# Patient Record
Sex: Female | Born: 1980 | Race: White | Hispanic: No | Smoking: Former smoker
Health system: Southern US, Community
[De-identification: ages and names within clinical notes are randomized; demographics above are authoritative.]

## PROBLEM LIST (undated history)

## (undated) DIAGNOSIS — T7840XA Allergy, unspecified, initial encounter: Secondary | ICD-10-CM

## (undated) DIAGNOSIS — R011 Cardiac murmur, unspecified: Secondary | ICD-10-CM

## (undated) DIAGNOSIS — J45909 Unspecified asthma, uncomplicated: Secondary | ICD-10-CM

## (undated) HISTORY — DX: Unspecified asthma, uncomplicated: J45.909

## (undated) HISTORY — PX: TONSILLECTOMY: SUR1361

## (undated) HISTORY — DX: Allergy, unspecified, initial encounter: T78.40XA

## (undated) HISTORY — PX: WISDOM TOOTH EXTRACTION: SHX21

---

## 2011-07-30 ENCOUNTER — Ambulatory Visit (INDEPENDENT_AMBULATORY_CARE_PROVIDER_SITE_OTHER): Payer: BC Managed Care – PPO | Admitting: Physician Assistant

## 2011-07-30 VITALS — BP 161/81 | HR 56 | Temp 98.5°F | Resp 18 | Ht 66.0 in | Wt 164.0 lb

## 2011-07-30 DIAGNOSIS — J302 Other seasonal allergic rhinitis: Secondary | ICD-10-CM

## 2011-07-30 DIAGNOSIS — L02419 Cutaneous abscess of limb, unspecified: Secondary | ICD-10-CM

## 2011-07-30 DIAGNOSIS — J309 Allergic rhinitis, unspecified: Secondary | ICD-10-CM

## 2011-07-30 DIAGNOSIS — L03119 Cellulitis of unspecified part of limb: Secondary | ICD-10-CM

## 2011-07-30 MED ORDER — SULFAMETHOXAZOLE-TRIMETHOPRIM 800-160 MG PO TABS: 1.0000 | ORAL_TABLET | Freq: Two times a day (BID) | ORAL | Status: AC

## 2011-07-30 NOTE — Patient Instructions (Signed)
Salt water soaks

## 2011-07-30 NOTE — Progress Notes (Signed)
  Subjective:    Patient ID: Angel Owens, female    DOB: [birth year], 31 y.o.   MRN: [id number]  HPI 31 yo CF presents w/ pruritic and painful area R lower inner thigh.  First noticed it last night.  Seemed like a pimple and she squeezed it.  She has not had a tick and she didn't feel anything bite her.  No f/c. No h/o MRSA or close contact with anyone with MRSA. She denies health problems other than allergies. Review of Systems  All other systems reviewed and are negative.        Objective:   Physical Exam  Nursing note and vitals reviewed. Constitutional: She is oriented to person, place, and time. She appears well-developed and well-nourished.  HENT:  Head: Normocephalic and atraumatic.  Cardiovascular: Normal rate and regular rhythm.   Murmur: 2/6 murmur. Pulmonary/Chest: Effort normal and breath sounds normal.  Neurological: She is alert and oriented to person, place, and time.  Skin:        Rechecked BP 132/82     Assessment & Plan:  Cellulitis thigh vs insect/spider bite- Take 10mg  zyrtec qd. Add septra.  Watch for worsening.

## 2013-06-09 LAB — US OB COMP LESS 14 WKS

## 2013-06-15 LAB — OB RESULTS CONSOLE RPR: RPR: NONREACTIVE

## 2013-06-15 LAB — OB RESULTS CONSOLE GC/CHLAMYDIA
CHLAMYDIA, DNA PROBE: NEGATIVE
GC PROBE AMP, GENITAL: NEGATIVE

## 2013-06-15 LAB — OB RESULTS CONSOLE ABO/RH: RH Type: NEGATIVE

## 2013-06-15 LAB — OB RESULTS CONSOLE HEPATITIS B SURFACE ANTIGEN: Hepatitis B Surface Ag: NEGATIVE

## 2013-06-15 LAB — OB RESULTS CONSOLE ANTIBODY SCREEN: Antibody Screen: NEGATIVE

## 2013-06-15 LAB — OB RESULTS CONSOLE HIV ANTIBODY (ROUTINE TESTING): HIV: NONREACTIVE

## 2013-06-15 LAB — OB RESULTS CONSOLE RUBELLA ANTIBODY, IGM: Rubella: IMMUNE

## 2013-07-08 ENCOUNTER — Other Ambulatory Visit: Payer: Self-pay

## 2013-07-08 LAB — US OB COMP LESS 14 WKS

## 2013-08-24 LAB — US OB DETAIL ADDL GEST + 14 WK

## 2013-08-25 ENCOUNTER — Other Ambulatory Visit (HOSPITAL_COMMUNITY): Payer: Self-pay | Admitting: Obstetrics and Gynecology

## 2013-08-25 DIAGNOSIS — O283 Abnormal ultrasonic finding on antenatal screening of mother: Secondary | ICD-10-CM

## 2013-08-25 DIAGNOSIS — Z3689 Encounter for other specified antenatal screening: Secondary | ICD-10-CM

## 2013-09-06 ENCOUNTER — Encounter (HOSPITAL_COMMUNITY): Payer: Self-pay

## 2013-09-06 ENCOUNTER — Other Ambulatory Visit: Payer: Self-pay

## 2013-09-06 ENCOUNTER — Ambulatory Visit (HOSPITAL_COMMUNITY)
Admission: RE | Admit: 2013-09-06 | Discharge: 2013-09-06 | Disposition: A | Discharge status: Home / Self Care | Payer: BC Managed Care – PPO | Source: Ambulatory Visit | Attending: Obstetrics and Gynecology | Admitting: Obstetrics and Gynecology

## 2013-09-06 VITALS — BP 111/67 | HR 65 | Wt 173.0 lb

## 2013-09-06 DIAGNOSIS — O283 Abnormal ultrasonic finding on antenatal screening of mother: Secondary | ICD-10-CM

## 2013-09-06 DIAGNOSIS — O289 Unspecified abnormal findings on antenatal screening of mother: Secondary | ICD-10-CM | POA: Insufficient documentation

## 2013-09-06 DIAGNOSIS — Z3689 Encounter for other specified antenatal screening: Secondary | ICD-10-CM | POA: Insufficient documentation

## 2013-09-06 LAB — CHROMOSOMES ANALYSIS FOR CF

## 2013-09-07 LAB — CMV IGM: CMV IgM: <8 AU/mL (ref <30.00)

## 2013-09-07 LAB — TOXOPLASMA ANTIBODIES- IGG AND  IGM

## 2013-09-07 LAB — CMV ANTIBODY, IGG (EIA)

## 2013-09-08 LAB — PARVOVIRUS B19 ANTIBODY, IGG AND IGM
PAROVIRUS B19 IGG ABS: 4.9 {index} — AB (ref <0.9)
Parovirus B19 IgM Abs: 0.2 index (ref <0.9)

## 2013-09-16 ENCOUNTER — Telehealth (HOSPITAL_COMMUNITY): Payer: Self-pay | Admitting: MS"

## 2013-09-16 NOTE — Telephone Encounter (Signed)
Left message for patient to return call.   Santiago Glad Erika Slaby 09/16/2013 4:23 PM

## 2013-09-17 NOTE — Telephone Encounter (Signed)
Called Karl Pock to discuss her cell free fetal DNA test results.  Mrs. Larayne Baxley had Panorama testing through [facility].  Testing was offered because of ultrasound finding of fetal echogenic bowel.   The patient was identified by name and DOB.  We reviewed that these are within normal limits, showing a less than 1 in 10,000 risk for trisomies 21, 18 and 13, and monosomy X (Turner syndrome).  In addition, the risk for triploidy/vanishing twin and sex chromosome trisomies (47,XXX and 47,XXY) was also low risk. We reviewed that this testing identifies > 99% of pregnancies with trisomy 74, trisomy 52, sex chromosome trisomies (47,XXX and 47,XXY), and triploidy. The detection rate for trisomy 18 is 96%.  The detection rate for monosomy X is ~92%.  The false positive rate is <0.1% for all conditions. Testing was also consistent with female fetal sex.  The patient did wish to know fetal sex.  She understands that this testing does not identify all genetic conditions.    Additionally, reviewed that TORCH titers were within normal limits. Cystic fibrosis carrier screening is still pending. We will contact the patient once CF carrier screening results are available. All questions were answered to her satisfaction, she was encouraged to call with additional questions or concerns.  Chipper Oman, MS Insurance risk surveyor

## 2013-09-20 ENCOUNTER — Encounter: Payer: Self-pay | Admitting: Gynecologic Oncology

## 2013-09-20 ENCOUNTER — Ambulatory Visit: Payer: BC Managed Care – PPO | Attending: Gynecologic Oncology | Admitting: Gynecologic Oncology

## 2013-09-20 DIAGNOSIS — O3482 Maternal care for other abnormalities of pelvic organs, second trimester: Secondary | ICD-10-CM

## 2013-09-20 DIAGNOSIS — N83209 Unspecified ovarian cyst, unspecified side: Secondary | ICD-10-CM | POA: Insufficient documentation

## 2013-09-20 DIAGNOSIS — O34599 Maternal care for other abnormalities of gravid uterus, unspecified trimester: Secondary | ICD-10-CM

## 2013-09-20 DIAGNOSIS — Z3492 Encounter for supervision of normal pregnancy, unspecified, second trimester: Secondary | ICD-10-CM | POA: Insufficient documentation

## 2013-09-20 DIAGNOSIS — J45909 Unspecified asthma, uncomplicated: Secondary | ICD-10-CM | POA: Insufficient documentation

## 2013-09-20 DIAGNOSIS — Z87891 Personal history of nicotine dependence: Secondary | ICD-10-CM | POA: Insufficient documentation

## 2013-09-20 DIAGNOSIS — Z79899 Other long term (current) drug therapy: Secondary | ICD-10-CM | POA: Insufficient documentation

## 2013-09-20 NOTE — Patient Instructions (Signed)
Please call our office if you have any concerns. Follow up with Dr. Julien Girt office.

## 2013-09-20 NOTE — Progress Notes (Signed)
Consult Note: Gyn-Onc  Consult was requested by Dr. Julien Girt for the evaluation of Angel Owens 33 y.o. female for a complex left ovarian mass in pregnancy.  CC:  Chief Complaint  Patient presents with  . Ovarian Cyst    Assessment/Plan:  Ms. Angel Owens  is a 33 y.o.  year old gravida 1 para 0 at approximately [redacted] weeks gestational age who is seen in consultation at the request of Dr. Julien Girt for a 6 cm complex solid appearing ovarian mass.  The concerning features of the mass are its solid and complex nature and its interval growth over a 10 week period on serial ultrasounds however overall my suspicion for malignancy is low. At this gestational age she would not be a candidate in my opinion for a minimally invasive approach to ovarian cystectomy. I generally reserved this approach for gestational ages 55-20 weeks. Therefore order to resect the mass a laparotomy would be necessary. I believe at this periviable gestational age that the risks of laparotomy outweighed the likelihood that this is malignant or will cause problems during the pregnancy (such as pain, torsion, or obstruction of labor).  Therefore I am regarding expectant management of the cyst for now. I recommend serial ultrasounds during the pregnancy, approximately monthly, to monitor the size and growth of the mass and potential for interference of the pregnancy or delivery. I do not recommend cesarean delivery unless indicated for obstetric reasons. However, if the patient does require a cesarean delivery, I would recommend left ovarian cystectomy or left salpingo-oophorectomy at the time of the surgery for definitive management and diagnosis of this mass. If the patient is to deliver vaginally, I recommend followup ultrasound at 6 weeks postpartum to confirm the presence of the lesion. If it is persistent at this time I would recommend surgical removal with a laparoscopy and ovarian cystectomy or salpingo-oophorectomy if  indicated. I believe this approach aware to offer the patient the best risk/benefit option.  Be happy to see the patient again after delivery for surgical management of the ovary if felt most appropriate by Dr. Julien Girt.   HPI: Angel Owens, is a 33 year old gravida 1 para 0 who is at [redacted] weeks gestational age with her first pregnancy and presents for consultation regarding her complex left ovarian cyst. The cyst was detected incidentally on first trimester ultrasound scan at 10 weeks. At that time it measured approximately 4 cm. A repeat ultrasound at [redacted] weeks GA confirmed persistence of the mass. It now measures 6.9 x 4.5 x 6.7 cm, with a simple appearing cyst noted on the mass measuring 2.7 x 3.9 x 2.5 cm. The ultrasound is not no ascites or nodularity the cul-de-sac. The patient continues to have no pain, bloating, early satiety, or symptoms from the mass.  Interval History: The patient's obstetrician Dr. Julien Girt has requested gynecologic oncology consultation, to determine whether intervention is necessary during the pregnancy.  Current Meds:  Outpatient Encounter Prescriptions as of 09/20/2013  Medication Sig  . Doxylamine-Pyridoxine (DICLEGIS PO) Take by mouth.  . fluticasone (FLONASE) 50 MCG/ACT nasal spray USE TWO SPRAYS IN EACH NOSTRIL DAILY  . montelukast (SINGULAIR) 10 MG tablet Take 10 mg by mouth at bedtime.  . Prenatal Vit-Fe Fumarate-FA (MULTIVITAMIN-PRENATAL) 27-0.8 MG TABS tablet Take 1 tablet by mouth daily at 12 noon.  . valACYclovir (VALTREX) 1000 MG tablet Take 1,000 mg by mouth 2 (two) times daily.    Allergy:  Allergies  Allergen Reactions  . Molds & Smuts     Runny  eyes     Social Hx:   History   Social History  . Marital Status: Divorced    Spouse Name: N/A    Number of Children: N/A  . Years of Education: N/A   Occupational History  . Not on file.   Social History Main Topics  . Smoking status: Former Research scientist (life sciences)  . Smokeless tobacco: Not on file  . Alcohol  Use: No  . Drug Use: No  . Sexual Activity: Yes   Other Topics Concern  . Not on file   Social History Narrative  . No narrative on file    Past Surgical Hx:  Past Surgical History  Procedure Laterality Date  . Wisdom tooth extraction    . Tonsillectomy      Past Medical Hx:  Past Medical History  Diagnosis Date  . Allergy   . Asthma     mild, allergy induced    Past Gynecological History:  The patient reports she has a history of making ovarian cysts. She's had one episode of the past ovarian cyst rupture, but has not required surgical intervention for ovarian cysts before. The pregnancy is otherwise uncomplicated. Patient's last menstrual period was 04/12/2013.  Family Hx:  Family History  Problem Relation Age of Onset  . Cancer Maternal Grandmother     pancreatic  . Cancer Maternal Grandfather     liver  . Cancer Paternal Grandfather     lung  . Cancer Other 50    ovarian cancer    Review of Systems:  Constitutional  Feels well,    ENT Normal appearing ears and nares bilaterally Skin/Breast  No rash, sores, jaundice, itching, dryness Cardiovascular  No chest pain, shortness of breath, or edema  Pulmonary  No cough or wheeze.  Gastro Intestinal  No nausea, vomitting, or diarrhoea. No bright red blood per rectum, no abdominal pain, change in bowel movement, or constipation.  Genito Urinary  No frequency, urgency, dysuria, see HPI Musculo Skeletal  No myalgia, arthralgia, joint swelling or pain  Neurologic  No weakness, numbness, change in gait,  Psychology  No depression, anxiety, insomnia.   Vitals:  Last menstrual period 04/12/2013.  Physical Exam: WD in NAD Neck  Supple NROM, without any enlargements.  Lymph Node Survey No cervical supraclavicular or inguinal adenopathy Cardiovascular  Pulse normal rate, regularity and rhythm. S1 and S2 normal.  Lungs  Clear to auscultation bilateraly, without wheezes/crackles/rhonchi. Good air movement.   Skin  No rash/lesions/breakdown  Psychiatry  Alert and oriented to person, place, and time  Abdomen  Normoactive bowel sounds, abdomen soft, non-tender and nonobese without evidence of hernia. Uterine fundus palpable approximately 2 fingerbreadths above the umbilicus. The ovarian mass is not palpable. Back No CVA tenderness Genito Urinary  Vulva/vagina: deferred Rectal  Good tone, no masses no cul de sac nodularity.  Extremities  No bilateral cyanosis, clubbing or edema.   Donaciano Eva, MD   09/20/2013, 10:18 AM

## 2013-09-24 ENCOUNTER — Telehealth (HOSPITAL_COMMUNITY): Payer: Self-pay | Admitting: MS"

## 2013-09-24 NOTE — Telephone Encounter (Signed)
Carrier screening for cystic fibrosis yielded a normal/negative result for the 32 most common disease-causing mutations, meaning that the risk to be a CF carrier can be reduced from 1 in 25 to approximately 1 in 250.  Therefore, the risk for cystic fibrosis in her current pregnancy is significantly reduced. The patient understands that CF carrier screening can reduce but not eliminate the chance to be a CF carrier.   Angel Owens 09/24/2013 4:33 PM

## 2013-09-24 NOTE — Telephone Encounter (Signed)
Left message for patient to return call.  Angel Owens 09/24/2013 4:30 PM

## 2013-09-29 ENCOUNTER — Encounter: Payer: Self-pay | Admitting: Obstetrics and Gynecology

## 2013-09-30 ENCOUNTER — Encounter: Payer: Self-pay | Admitting: Gynecologic Oncology

## 2013-10-11 ENCOUNTER — Ambulatory Visit (HOSPITAL_COMMUNITY): Payer: BC Managed Care – PPO

## 2014-01-03 ENCOUNTER — Encounter: Payer: Self-pay | Admitting: Gynecologic Oncology

## 2014-01-10 ENCOUNTER — Encounter (HOSPITAL_COMMUNITY): Payer: Self-pay

## 2014-01-10 ENCOUNTER — Encounter (HOSPITAL_COMMUNITY)
Admission: RE | Admit: 2014-01-10 | Discharge: 2014-01-10 | Disposition: A | Discharge status: Home / Self Care | Payer: BC Managed Care – PPO | Source: Ambulatory Visit | Attending: Obstetrics and Gynecology | Admitting: Obstetrics and Gynecology

## 2014-01-10 ENCOUNTER — Encounter (HOSPITAL_COMMUNITY): Payer: Self-pay | Admitting: Anesthesiology

## 2014-01-10 HISTORY — DX: Cardiac murmur, unspecified: R01.1

## 2014-01-10 LAB — CBC
HCT: 31.2 % — ABNORMAL LOW (ref 36.0–46.0)
Hemoglobin: 9.9 g/dL — ABNORMAL LOW (ref 12.0–15.0)
MCH: 26.1 pg (ref 26.0–34.0)
MCHC: 31.7 g/dL (ref 30.0–36.0)
MCV: 82.3 fL (ref 78.0–100.0)
Platelets: 401 10*3/uL — ABNORMAL HIGH (ref 150–400)
RBC: 3.79 MIL/uL — ABNORMAL LOW (ref 3.87–5.11)
RDW: 14.6 % (ref 11.5–15.5)
WBC: 12.1 10*3/uL — AB (ref 4.0–10.5)

## 2014-01-10 LAB — RPR

## 2014-01-10 NOTE — Pre-Procedure Instructions (Signed)
Per Roselyn Reef in lab, pt is positive for blood antibody and lab will set up units in prep for surgery.

## 2014-01-10 NOTE — H&P (Addendum)
Malika Demario is a 33 y.o. female G3P0 @ 49 wks presenting for c-section and exploratory laparotomy   Pt has been followed throughout pregnancy for pelvic mass ?fibroid vx ovarian mass.    History OB History    Gravida Para Term Preterm AB TAB SAB Ectopic Multiple Living   3 0 0 0 2 2 0 0 0 0      Past Medical History  Diagnosis Date  . Allergy   . Asthma     mild, allergy induced  . Heart murmur     asymptomatic   Past Surgical History  Procedure Laterality Date  . Wisdom tooth extraction    . Tonsillectomy     Family History: family history includes Cancer in her maternal grandfather, maternal grandmother, and paternal grandfather; Cancer (age of onset: 48) in her other. Social History:  reports that she has quit smoking. She does not have any smokeless tobacco history on file. She reports that she does not drink alcohol or use illicit drugs.   Prenatal Transfer Tool  Maternal Diabetes: No Genetic Screening: Normal Maternal Ultrasounds/Referrals: Abnormal:  Findings:   Other: complex 7cm mass in CDS Fetal Ultrasounds or other Referrals:  None Maternal Substance Abuse:  No Significant Maternal Medications:  None Significant Maternal Lab Results:  None Other Comments:  None  ROS    Last menstrual period 04/12/2013. Exam Physical Exam  BP 126/80 Gen - NAD Abd - gravid, NT Ext - NT CV - rrr Lungs - clear PV - cvx 1cm Prenatal labs: ABO, Rh: --/--/A NEG (11/09 1226) Antibody: POS (11/09 1226) Rubella: Immune (04/14 0000) RPR: Nonreactive (04/14 0000)  HBsAg: Negative (04/14 0000)  HIV: Non-reactive (04/14 0000)  GBS:     Assessment/Plan: IUP at term with complex mass in CDS Plan c-section with evaluation of mass R/b/a discussed, questions answered, informed consent   Vasili Fok 01/10/2014, 4:32 PM

## 2014-01-10 NOTE — Patient Instructions (Signed)
Your procedure is scheduled on:01/11/14  Enter through the Main Entrance at :[place] up desk phone and dial [phone] and inform us of your arrival.  Please call [phone] if you have any problems the morning of surgery.  Remember: Do not eat food or drink liquids, including water, after midnight:tonight   You may brush your teeth the morning of surgery  DO NOT wear jewelry, eye make-up, lipstick,body lotion, or dark fingernail polish.  (Polished toes are ok) You may wear deodorant.  If you are to be admitted after surgery, leave suitcase in car until your room has been assigned. Patients discharged on the day of surgery will not be allowed to drive home. Wear loose fitting, comfortable clothes for your ride home.

## 2014-01-10 NOTE — Anesthesia Preprocedure Evaluation (Addendum)
Anesthesia Evaluation  Patient identified by MRN, date of birth, ID band Patient awake    Reviewed: Allergy & Precautions, H&P , NPO status , Patient's Chart, lab work & pertinent test results  Airway Mallampati: II  TM Distance: >3 FB Neck ROM: Full    Dental no notable dental hx. (+) Teeth Intact   Pulmonary asthma , former smoker,  breath sounds clear to auscultation  Pulmonary exam normal       Cardiovascular negative cardio ROS  + Valvular Problems/Murmurs MR Rhythm:Regular Rate:Normal + Systolic murmurs    Neuro/Psych negative neurological ROS  negative psych ROS   GI/Hepatic Neg liver ROS, GERD-  Medicated and Controlled,  Endo/Other  Obesity  Renal/GU negative Renal ROS  negative genitourinary   Musculoskeletal negative musculoskeletal ROS (+)   Abdominal   Peds  Hematology  (+) anemia ,   Anesthesia Other Findings   Reproductive/Obstetrics (+) Pregnancy Mass in cul de sac                           Anesthesia Physical Anesthesia Plan  ASA: II  Anesthesia Plan: Combined Spinal and Epidural   Post-op Pain Management:    Induction:   Airway Management Planned: Natural Airway  Additional Equipment:   Intra-op Plan:   Post-operative Plan:   Informed Consent: I have reviewed the patients History and Physical, chart, labs and discussed the procedure including the risks, benefits and alternatives for the proposed anesthesia with the patient or authorized representative who has indicated his/her understanding and acceptance.   Dental advisory given  Plan Discussed with: Anesthesiologist, CRNA and Surgeon  Anesthesia Plan Comments:        Anesthesia Quick Evaluation

## 2014-01-11 ENCOUNTER — Inpatient Hospital Stay (HOSPITAL_COMMUNITY): Payer: BC Managed Care – PPO | Admitting: Anesthesiology

## 2014-01-11 ENCOUNTER — Encounter (HOSPITAL_COMMUNITY): Payer: Self-pay | Admitting: Anesthesiology

## 2014-01-11 ENCOUNTER — Inpatient Hospital Stay (HOSPITAL_COMMUNITY)
Admission: AD | Admit: 2014-01-11 | Discharge: 2014-01-13 | DRG: 766 | Disposition: A | Discharge status: Home / Self Care | Payer: BC Managed Care – PPO | Source: Ambulatory Visit | Attending: Obstetrics and Gynecology | Admitting: Obstetrics and Gynecology

## 2014-01-11 ENCOUNTER — Encounter (HOSPITAL_COMMUNITY)
Admission: AD | Disposition: A | Discharge status: Home / Self Care | Payer: Self-pay | Source: Ambulatory Visit | Attending: Obstetrics and Gynecology

## 2014-01-11 DIAGNOSIS — O3482 Maternal care for other abnormalities of pelvic organs, second trimester: Secondary | ICD-10-CM

## 2014-01-11 DIAGNOSIS — N838 Other noninflammatory disorders of ovary, fallopian tube and broad ligament: Secondary | ICD-10-CM | POA: Diagnosis present

## 2014-01-11 DIAGNOSIS — Z87891 Personal history of nicotine dependence: Secondary | ICD-10-CM

## 2014-01-11 DIAGNOSIS — N83209 Unspecified ovarian cyst, unspecified side: Secondary | ICD-10-CM

## 2014-01-11 DIAGNOSIS — O9989 Other specified diseases and conditions complicating pregnancy, childbirth and the puerperium: Secondary | ICD-10-CM | POA: Diagnosis present

## 2014-01-11 DIAGNOSIS — D271 Benign neoplasm of left ovary: Secondary | ICD-10-CM | POA: Diagnosis present

## 2014-01-11 DIAGNOSIS — O99613 Diseases of the digestive system complicating pregnancy, third trimester: Secondary | ICD-10-CM | POA: Diagnosis present

## 2014-01-11 DIAGNOSIS — K219 Gastro-esophageal reflux disease without esophagitis: Secondary | ICD-10-CM | POA: Diagnosis present

## 2014-01-11 DIAGNOSIS — O3483 Maternal care for other abnormalities of pelvic organs, third trimester: Secondary | ICD-10-CM | POA: Diagnosis present

## 2014-01-11 DIAGNOSIS — Z3492 Encounter for supervision of normal pregnancy, unspecified, second trimester: Secondary | ICD-10-CM

## 2014-01-11 DIAGNOSIS — J302 Other seasonal allergic rhinitis: Secondary | ICD-10-CM

## 2014-01-11 LAB — PREPARE RBC (CROSSMATCH)

## 2014-01-11 SURGERY — Surgical Case
Anesthesia: Spinal

## 2014-01-11 MED ORDER — SODIUM CHLORIDE 0.9 % IJ SOLN
3.0000 mL | INTRAMUSCULAR | Status: DC | PRN
  Administered 2014-01-11: 3 mL via INTRAVENOUS
  Filled 2014-01-11: qty 3

## 2014-01-11 MED ORDER — SENNOSIDES-DOCUSATE SODIUM 8.6-50 MG PO TABS
2.0000 | ORAL_TABLET | ORAL | Status: DC
  Administered 2014-01-12 – 2014-01-13 (×2): 2 via ORAL
  Filled 2014-01-11 (×2): qty 2

## 2014-01-11 MED ORDER — FLUTICASONE PROPIONATE 50 MCG/ACT NA SUSP: 1.0000 | Freq: Every day | NASAL | Status: DC | PRN

## 2014-01-11 MED ORDER — DIBUCAINE 1 % RE OINT: 1.0000 "application " | TOPICAL_OINTMENT | RECTAL | Status: DC | PRN

## 2014-01-11 MED ORDER — MENTHOL 3 MG MT LOZG
1.0000 | LOZENGE | OROMUCOSAL | Status: DC | PRN
  Administered 2014-01-11: 3 mg via ORAL
  Filled 2014-01-11: qty 9

## 2014-01-11 MED ORDER — SODIUM CHLORIDE 0.9 % IJ SOLN
INTRAMUSCULAR | Status: AC
  Filled 2014-01-11: qty 10

## 2014-01-11 MED ORDER — EPHEDRINE SULFATE 50 MG/ML IJ SOLN
INTRAMUSCULAR | Status: DC | PRN
  Administered 2014-01-11 (×2): 5 mg via INTRAVENOUS

## 2014-01-11 MED ORDER — KETOROLAC TROMETHAMINE 30 MG/ML IJ SOLN
INTRAMUSCULAR | Status: AC
  Administered 2014-01-11: 30 mg via INTRAMUSCULAR
  Filled 2014-01-11: qty 1

## 2014-01-11 MED ORDER — MORPHINE SULFATE 0.5 MG/ML IJ SOLN
INTRAMUSCULAR | Status: AC
  Filled 2014-01-11: qty 10

## 2014-01-11 MED ORDER — IBUPROFEN 600 MG PO TABS
600.0000 mg | ORAL_TABLET | Freq: Four times a day (QID) | ORAL | Status: DC
  Administered 2014-01-12 – 2014-01-13 (×7): 600 mg via ORAL
  Filled 2014-01-11 (×7): qty 1

## 2014-01-11 MED ORDER — DIPHENHYDRAMINE HCL 50 MG/ML IJ SOLN: 12.5000 mg | INTRAMUSCULAR | Status: DC | PRN

## 2014-01-11 MED ORDER — SCOPOLAMINE 1 MG/3DAYS TD PT72
MEDICATED_PATCH | TRANSDERMAL | Status: AC
  Administered 2014-01-11: 1.5 mg via TRANSDERMAL
  Filled 2014-01-11: qty 1

## 2014-01-11 MED ORDER — PRENATAL MULTIVITAMIN CH
1.0000 | ORAL_TABLET | Freq: Every day | ORAL | Status: DC
  Administered 2014-01-12 – 2014-01-13 (×2): 1 via ORAL
  Filled 2014-01-11 (×2): qty 1

## 2014-01-11 MED ORDER — SCOPOLAMINE 1 MG/3DAYS TD PT72
1.0000 | MEDICATED_PATCH | Freq: Once | TRANSDERMAL | Status: DC
  Administered 2014-01-11: 1.5 mg via TRANSDERMAL

## 2014-01-11 MED ORDER — SODIUM BICARBONATE 8.4 % IV SOLN
INTRAVENOUS | Status: AC
  Filled 2014-01-11: qty 50

## 2014-01-11 MED ORDER — CEFAZOLIN SODIUM-DEXTROSE 2-3 GM-% IV SOLR
INTRAVENOUS | Status: AC
  Filled 2014-01-11: qty 50

## 2014-01-11 MED ORDER — SIMETHICONE 80 MG PO CHEW
80.0000 mg | CHEWABLE_TABLET | Freq: Three times a day (TID) | ORAL | Status: DC
  Administered 2014-01-12 – 2014-01-13 (×2): 80 mg via ORAL
  Filled 2014-01-11 (×2): qty 1

## 2014-01-11 MED ORDER — MONTELUKAST SODIUM 10 MG PO TABS
10.0000 mg | ORAL_TABLET | Freq: Every day | ORAL | Status: DC
  Administered 2014-01-11 – 2014-01-12 (×2): 10 mg via ORAL
  Filled 2014-01-11 (×4): qty 1

## 2014-01-11 MED ORDER — CEFAZOLIN SODIUM-DEXTROSE 2-3 GM-% IV SOLR
2.0000 g | INTRAVENOUS | Status: AC
  Administered 2014-01-11: 2 g via INTRAVENOUS

## 2014-01-11 MED ORDER — DIPHENHYDRAMINE HCL 25 MG PO CAPS
25.0000 mg | ORAL_CAPSULE | ORAL | Status: DC | PRN
  Filled 2014-01-11: qty 1

## 2014-01-11 MED ORDER — LIDOCAINE-EPINEPHRINE 2 %-1:100000 IJ SOLN: INTRAMUSCULAR | Status: DC | PRN

## 2014-01-11 MED ORDER — NALBUPHINE HCL 10 MG/ML IJ SOLN: 5.0000 mg | Freq: Once | INTRAMUSCULAR | Status: AC | PRN

## 2014-01-11 MED ORDER — NALBUPHINE HCL 10 MG/ML IJ SOLN
5.0000 mg | INTRAMUSCULAR | Status: DC | PRN
  Filled 2014-01-11: qty 1

## 2014-01-11 MED ORDER — ONDANSETRON HCL 4 MG/2ML IJ SOLN: 4.0000 mg | INTRAMUSCULAR | Status: DC | PRN

## 2014-01-11 MED ORDER — OXYTOCIN 10 UNIT/ML IJ SOLN
INTRAMUSCULAR | Status: AC
  Filled 2014-01-11: qty 4

## 2014-01-11 MED ORDER — DEXTROSE IN LACTATED RINGERS 5 % IV SOLN
INTRAVENOUS | Status: DC
  Administered 2014-01-11: 17:00:00 via INTRAVENOUS

## 2014-01-11 MED ORDER — LACTATED RINGERS IV SOLN
INTRAVENOUS | Status: DC | PRN
Start: 2014-01-11 — End: 2014-01-11
  Administered 2014-01-11: 08:00:00 via INTRAVENOUS

## 2014-01-11 MED ORDER — NALBUPHINE HCL 10 MG/ML IJ SOLN
5.0000 mg | Freq: Once | INTRAMUSCULAR | Status: AC | PRN
  Administered 2014-01-11: 5 mg via INTRAVENOUS

## 2014-01-11 MED ORDER — ONDANSETRON HCL 4 MG PO TABS: 4.0000 mg | ORAL_TABLET | ORAL | Status: DC | PRN

## 2014-01-11 MED ORDER — MEDROXYPROGESTERONE ACETATE 150 MG/ML IM SUSP: 150.0000 mg | INTRAMUSCULAR | Status: DC | PRN

## 2014-01-11 MED ORDER — KETOROLAC TROMETHAMINE 30 MG/ML IJ SOLN
30.0000 mg | Freq: Four times a day (QID) | INTRAMUSCULAR | Status: AC | PRN
  Administered 2014-01-11: 30 mg via INTRAMUSCULAR

## 2014-01-11 MED ORDER — TETANUS-DIPHTH-ACELL PERTUSSIS 5-2.5-18.5 LF-MCG/0.5 IM SUSP: 0.5000 mL | Freq: Once | INTRAMUSCULAR | Status: DC

## 2014-01-11 MED ORDER — OXYCODONE-ACETAMINOPHEN 5-325 MG PO TABS: 2.0000 | ORAL_TABLET | ORAL | Status: DC | PRN

## 2014-01-11 MED ORDER — OXYTOCIN 10 UNIT/ML IJ SOLN
40.0000 [IU] | INTRAVENOUS | Status: DC | PRN
  Administered 2014-01-11: 40 [IU] via INTRAVENOUS

## 2014-01-11 MED ORDER — ONDANSETRON HCL 4 MG/2ML IJ SOLN
INTRAMUSCULAR | Status: DC | PRN
  Administered 2014-01-11: 4 mg via INTRAVENOUS

## 2014-01-11 MED ORDER — SCOPOLAMINE 1 MG/3DAYS TD PT72: 1.0000 | MEDICATED_PATCH | Freq: Once | TRANSDERMAL | Status: DC

## 2014-01-11 MED ORDER — NALOXONE HCL 1 MG/ML IJ SOLN
1.0000 ug/kg/h | INTRAVENOUS | Status: DC | PRN
  Filled 2014-01-11: qty 2

## 2014-01-11 MED ORDER — DIPHENHYDRAMINE HCL 25 MG PO CAPS: 25.0000 mg | ORAL_CAPSULE | Freq: Four times a day (QID) | ORAL | Status: DC | PRN

## 2014-01-11 MED ORDER — FENTANYL CITRATE 0.05 MG/ML IJ SOLN
INTRAMUSCULAR | Status: DC | PRN
  Administered 2014-01-11: 100 ug via EPIDURAL

## 2014-01-11 MED ORDER — PHENYLEPHRINE 8 MG IN D5W 100 ML (0.08MG/ML) PREMIX OPTIME
INJECTION | INTRAVENOUS | Status: DC | PRN
  Administered 2014-01-11: 60 ug/min via INTRAVENOUS

## 2014-01-11 MED ORDER — LANOLIN HYDROUS EX OINT: 1.0000 "application " | TOPICAL_OINTMENT | CUTANEOUS | Status: DC | PRN

## 2014-01-11 MED ORDER — LIDOCAINE-EPINEPHRINE (PF) 2 %-1:200000 IJ SOLN
INTRAMUSCULAR | Status: DC | PRN
  Administered 2014-01-11: 5 mL via EPIDURAL
  Administered 2014-01-11: 11 mL via EPIDURAL
  Administered 2014-01-11: 4 mL via EPIDURAL

## 2014-01-11 MED ORDER — ONDANSETRON HCL 4 MG/2ML IJ SOLN
INTRAMUSCULAR | Status: AC
  Filled 2014-01-11: qty 2

## 2014-01-11 MED ORDER — PHENYLEPHRINE HCL 10 MG/ML IJ SOLN
INTRAMUSCULAR | Status: DC | PRN
  Administered 2014-01-11 (×2): 80 ug via INTRAVENOUS
  Administered 2014-01-11: 40 ug via INTRAVENOUS

## 2014-01-11 MED ORDER — OXYTOCIN 40 UNITS IN LACTATED RINGERS INFUSION - SIMPLE MED: 62.5000 mL/h | INTRAVENOUS | Status: AC

## 2014-01-11 MED ORDER — MEPERIDINE HCL 25 MG/ML IJ SOLN: 6.2500 mg | INTRAMUSCULAR | Status: DC | PRN

## 2014-01-11 MED ORDER — MEASLES, MUMPS & RUBELLA VAC ~~LOC~~ INJ: 0.5000 mL | INJECTION | Freq: Once | SUBCUTANEOUS | Status: DC

## 2014-01-11 MED ORDER — SIMETHICONE 80 MG PO CHEW
80.0000 mg | CHEWABLE_TABLET | ORAL | Status: DC
  Administered 2014-01-12 – 2014-01-13 (×2): 80 mg via ORAL
  Filled 2014-01-11 (×2): qty 1

## 2014-01-11 MED ORDER — SCOPOLAMINE 1 MG/3DAYS TD PT72: 1.0000 | MEDICATED_PATCH | TRANSDERMAL | Status: DC

## 2014-01-11 MED ORDER — LACTATED RINGERS IV SOLN
INTRAVENOUS | Status: DC
  Administered 2014-01-11 (×3): via INTRAVENOUS

## 2014-01-11 MED ORDER — LIDOCAINE-EPINEPHRINE (PF) 2 %-1:200000 IJ SOLN
INTRAMUSCULAR | Status: AC
  Filled 2014-01-11: qty 20

## 2014-01-11 MED ORDER — FENTANYL CITRATE 0.05 MG/ML IJ SOLN: 25.0000 ug | INTRAMUSCULAR | Status: DC | PRN

## 2014-01-11 MED ORDER — FENTANYL CITRATE 0.05 MG/ML IJ SOLN
INTRAMUSCULAR | Status: AC
  Filled 2014-01-11: qty 2

## 2014-01-11 MED ORDER — NALOXONE HCL 0.4 MG/ML IJ SOLN: 0.4000 mg | INTRAMUSCULAR | Status: DC | PRN

## 2014-01-11 MED ORDER — MORPHINE SULFATE (PF) 0.5 MG/ML IJ SOLN
INTRAMUSCULAR | Status: DC | PRN
  Administered 2014-01-11: 3 mg via EPIDURAL

## 2014-01-11 MED ORDER — 0.9 % SODIUM CHLORIDE (POUR BTL) OPTIME
TOPICAL | Status: DC | PRN
  Administered 2014-01-11: 1000 mL

## 2014-01-11 MED ORDER — SIMETHICONE 80 MG PO CHEW: 80.0000 mg | CHEWABLE_TABLET | ORAL | Status: DC | PRN

## 2014-01-11 MED ORDER — PHENYLEPHRINE 8 MG IN D5W 100 ML (0.08MG/ML) PREMIX OPTIME: INJECTION | INTRAVENOUS | Status: DC | PRN

## 2014-01-11 MED ORDER — OXYCODONE-ACETAMINOPHEN 5-325 MG PO TABS
1.0000 | ORAL_TABLET | ORAL | Status: DC | PRN
  Administered 2014-01-13: 1 via ORAL
  Filled 2014-01-11: qty 1

## 2014-01-11 MED ORDER — VALACYCLOVIR HCL 500 MG PO TABS: 1000.0000 mg | ORAL_TABLET | Freq: Two times a day (BID) | ORAL | Status: DC

## 2014-01-11 MED ORDER — WITCH HAZEL-GLYCERIN EX PADS: 1.0000 "application " | MEDICATED_PAD | CUTANEOUS | Status: DC | PRN

## 2014-01-11 MED ORDER — PHENYLEPHRINE 8 MG IN D5W 100 ML (0.08MG/ML) PREMIX OPTIME
INJECTION | INTRAVENOUS | Status: AC
  Filled 2014-01-11: qty 100

## 2014-01-11 MED ORDER — KETOROLAC TROMETHAMINE 30 MG/ML IJ SOLN
30.0000 mg | Freq: Four times a day (QID) | INTRAMUSCULAR | Status: AC | PRN
  Administered 2014-01-11: 30 mg via INTRAVENOUS
  Filled 2014-01-11: qty 1

## 2014-01-11 MED ORDER — ONDANSETRON HCL 4 MG/2ML IJ SOLN: 4.0000 mg | Freq: Three times a day (TID) | INTRAMUSCULAR | Status: DC | PRN

## 2014-01-11 MED ORDER — NALBUPHINE HCL 10 MG/ML IJ SOLN: 5.0000 mg | INTRAMUSCULAR | Status: DC | PRN

## 2014-01-11 SURGICAL SUPPLY — 32 items
CLAMP CORD UMBIL (MISCELLANEOUS) IMPLANT
CLOTH BEACON ORANGE TIMEOUT ST (SAFETY) ×3 IMPLANT
DERMABOND ADVANCED (GAUZE/BANDAGES/DRESSINGS) ×2
DERMABOND ADVANCED .7 DNX12 (GAUZE/BANDAGES/DRESSINGS) ×1 IMPLANT
DRAPE SHEET LG 3/4 BI-LAMINATE (DRAPES) IMPLANT
DRSG OPSITE POSTOP 4X10 (GAUZE/BANDAGES/DRESSINGS) ×3 IMPLANT
DURAPREP 26ML APPLICATOR (WOUND CARE) ×3 IMPLANT
ELECT REM PT RETURN 9FT ADLT (ELECTROSURGICAL) ×3
ELECTRODE REM PT RTRN 9FT ADLT (ELECTROSURGICAL) ×1 IMPLANT
EXTRACTOR VACUUM M CUP 4 TUBE (SUCTIONS) IMPLANT
EXTRACTOR VACUUM M CUP 4' TUBE (SUCTIONS)
GLOVE BIO SURGEON STRL SZ 6.5 (GLOVE) ×2 IMPLANT
GLOVE BIO SURGEONS STRL SZ 6.5 (GLOVE) ×1
GLOVE BIOGEL PI IND STRL 7.0 (GLOVE) ×1 IMPLANT
GLOVE BIOGEL PI INDICATOR 7.0 (GLOVE) ×2
GOWN STRL REUS W/TWL LRG LVL3 (GOWN DISPOSABLE) ×6 IMPLANT
KIT ABG SYR 3ML LUER SLIP (SYRINGE) IMPLANT
LIQUID BAND (GAUZE/BANDAGES/DRESSINGS) ×3 IMPLANT
NEEDLE HYPO 25X5/8 SAFETYGLIDE (NEEDLE) IMPLANT
NS IRRIG 1000ML POUR BTL (IV SOLUTION) ×3 IMPLANT
PACK C SECTION WH (CUSTOM PROCEDURE TRAY) ×3 IMPLANT
PAD OB MATERNITY 4.3X12.25 (PERSONAL CARE ITEMS) ×3 IMPLANT
STAPLER VISISTAT 35W (STAPLE) IMPLANT
SUT CHROMIC 0 CT 802H (SUTURE) IMPLANT
SUT CHROMIC 0 CTX 36 (SUTURE) ×9 IMPLANT
SUT MON AB-0 CT1 36 (SUTURE) ×3 IMPLANT
SUT PDS AB 0 CTX 60 (SUTURE) ×3 IMPLANT
SUT PLAIN 0 NONE (SUTURE) IMPLANT
SUT VIC AB 4-0 KS 27 (SUTURE) IMPLANT
TOWEL OR 17X24 6PK STRL BLUE (TOWEL DISPOSABLE) ×3 IMPLANT
TRAY FOLEY CATH 14FR (SET/KITS/TRAYS/PACK) ×3 IMPLANT
WATER STERILE IRR 1000ML POUR (IV SOLUTION) ×3 IMPLANT

## 2014-01-11 NOTE — Addendum Note (Signed)
Addendum  created 01/11/14 1358 by Ignacia Bayley, CRNA   Modules edited: Notes Section   Notes Section:  File: [id number]

## 2014-01-11 NOTE — Lactation Note (Signed)
This note was copied from the chart of Angel Owens. Lactation Consultation Note; Lactation Brochure given with basic teaching done. This is mother's first child. Mother taught hand expression and a few drops of colostrum observed. Reviewed Baby and me book and cue card. FOB at bedside for all teaching. Infant placed STS and began to show feeding cues. Infant latched on and off for 30 mins. Mother advised to breastfeed infant at least 8-12 times in 24 hours. Lots of teaching done . Mother informed of available [place] services and community support. Parents receptive to all teaching.   Patient Name: Angel Owens ENIDP'O Date: 01/11/2014 Reason for consult: Initial assessment   Maternal Data Has patient been taught Hand Expression?: Yes Does the patient have breastfeeding experience prior to this delivery?: No  Feeding Feeding Type: Breast Fed  LATCH Score/Interventions Latch: Grasps breast easily, tongue down, lips flanged, rhythmical sucking.  Audible Swallowing: A few with stimulation Intervention(s): Skin to skin;Hand expression  Type of Nipple: Everted at rest and after stimulation  Comfort (Breast/Nipple): Soft / non-tender     Hold (Positioning): Assistance needed to correctly position infant at breast and maintain latch. Intervention(s): Breastfeeding basics reviewed;Support Pillows;Position options;Skin to skin  LATCH Score: 8  Lactation Tools Discussed/Used     Consult Status Consult Status: Follow-up Date: 01/12/14 Follow-up type: In-patient    Jess Barters [facility] 01/11/2014, 2:06 PM

## 2014-01-11 NOTE — Plan of Care (Signed)
Problem: Phase I Progression Outcomes Goal: Foley catheter patent Outcome: Completed/Met Date Met:  01/11/14 Goal: OOB as tolerated unless otherwise ordered Outcome: Completed/Met Date Met:  01/11/14 Goal: IS, TCDB as ordered Outcome: Completed/Met Date Met:  01/11/14 Goal: Initial discharge plan identified Outcome: Completed/Met Date Met:  01/11/14

## 2014-01-11 NOTE — Anesthesia Procedure Notes (Signed)
Epidural Patient location during procedure: OB Start time: 01/11/2014 7:37 AM  Staffing Anesthesiologist: Rudean Curt Performed by: anesthesiologist   Preanesthetic Checklist Completed: patient identified, site marked, surgical consent, pre-op evaluation, timeout performed, IV checked, risks and benefits discussed and monitors and equipment checked  Epidural Patient position: sitting Prep: site prepped and draped and DuraPrep Patient monitoring: continuous pulse ox and blood pressure Approach: midline Location: L3-L4 Injection technique: LOR air  Needle:  Needle type: Tuohy  Needle gauge: 17 G Needle length: 9 cm and 9 Needle insertion depth: 5 cm cm Catheter type: closed end flexible Catheter size: 19 Gauge Catheter at skin depth: 10 cm Test dose: negative  Assessment Events: blood not aspirated, injection not painful, no injection resistance, negative IV test and no paresthesia  Additional Notes Patient identified.  Risk benefits discussed including failed block, incomplete pain control, headache, nerve damage, paralysis, blood pressure changes, nausea, vomiting, reactions to medication both toxic or allergic, and postpartum back pain.  Patient expressed understanding and wished to proceed.  All questions were answered.  Sterile technique used throughout procedure and epidural site dressed with sterile barrier dressing. No paresthesia or other complications noted.The patient did not experience any signs of intravascular injection such as tinnitus or metallic taste in mouth nor signs of intrathecal spread such as rapid motor block. Please see nursing notes for vital signs.

## 2014-01-11 NOTE — Transfer of Care (Signed)
Immediate Anesthesia Transfer of Care Note  Patient: Floreen Teegarden  Procedure(s) Performed: Procedure(s) with comments: CESAREAN SECTION and left oophorectomy and resection of left broad ligament inclusion cyst  (N/A) - Primary  edc 01/17/14  Patient Location: PACU  Anesthesia Type:Epidural  Level of Consciousness: awake, alert , oriented and patient cooperative  Airway & Oxygen Therapy: Patient Spontanous Breathing  Post-op Assessment: Report given to PACU RN and Post -op Vital signs reviewed and stable  Post vital signs: Reviewed and stable  Complications: No apparent anesthesia complications

## 2014-01-11 NOTE — Anesthesia Postprocedure Evaluation (Signed)
Anesthesia Post Note  Patient: Angel Owens  Procedure(s) Performed: Procedure(s) (LRB): CESAREAN SECTION and left oophorectomy and resection of left broad ligament inclusion cyst  (N/A)  Anesthesia type: Epidural  Patient location: PACU  Post pain: Pain level controlled  Post assessment: Post-op Vital signs reviewed  Last Vitals:  Filed Vitals:   01/11/14 0608  BP: 129/76  Pulse: 93  Temp: 36.7 C  Resp: 18    Post vital signs: Reviewed  Level of consciousness: awake  Complications: No apparent anesthesia complications

## 2014-01-11 NOTE — Anesthesia Postprocedure Evaluation (Signed)
  Anesthesia Post-op Note  Patient: Angel Owens  Procedure(s) Performed: Procedure(s) with comments: CESAREAN SECTION and left oophorectomy and resection of left broad ligament inclusion cyst  (N/A) - Primary  edc 01/17/14  Patient Location: Mother/Baby  Anesthesia Type:Epidural  Level of Consciousness: awake  Airway and Oxygen Therapy: Patient Spontanous Breathing  Post-op Pain: mild  Post-op Assessment: Patient's Cardiovascular Status Stable and Respiratory Function Stable  Post-op Vital Signs: stable  Last Vitals:  Filed Vitals:   01/11/14 1329  BP: 100/68  Pulse: 71  Temp: 37 C  Resp: 20    Complications: No apparent anesthesia complications

## 2014-01-11 NOTE — Op Note (Signed)
Cesarean Section Procedure Note   Angel Owens  01/11/2014  Indications: Scheduled Proceedure/Maternal Request and ovarian mass    Pre-operative Diagnosis: complex mass in cul de sac.   Post-operative Diagnosis: iup @ 39w, left ovarian mass, left broad ligament inclusion cyst   Surgeon: Surgeon(s) and Role:    * Marylynn Pearson, MD - Primary  Assistants: Dian Queen, MD  Anesthesia: epidural   Procedure Details:  The patient was seen in the Holding Room. The risks, benefits, complications, treatment options, and expected outcomes were discussed with the patient. The patient concurred with the proposed plan, giving informed consent. identified as Angel Owens and the procedure verified as C-Section Delivery. A Time Out was held and the above information confirmed.  After induction of anesthesia, the patient was draped and prepped in the usual sterile manner. A transverse was made and carried down through the subcutaneous tissue to the fascia. Fascial incision was made and extended transversely. The fascia was separated from the underlying rectus tissue superiorly and inferiorly. The peritoneum was identified and entered. Peritoneal incision was extended longitudinally. The utero-vesical peritoneal reflection was incised transversely and the bladder flap was bluntly freed from the lower uterine segment. A low transverse uterine incision was made. Delivered from cephalic presentation was a vigerous female infant.  Cord ph was not sent the umbilical cord was clamped and cut cord blood was obtained for evaluation. The placenta was removed Intact and appeared normal. The uterine outline, tubes and ovaries appeared normal}. The uterine incision was closed with running locked sutures of 0chromic gut.   Hemostasis was observed. Lavage was carried out until clear.  Left ovary was then lifted out of the cul de sac and noted to be adhered to the bowel and posterior uterine wall.  Sharp and  blunt dissection were used to free the ovary from the uterus and bowel.  Haney clamps were used to clamp utero-ovaian ligament.  The ovary was excised with curved mayo scissors and hemostasis was achieved with vicryl.  Left broad ligament cyst was identified.  Peritoneum was carefully opened using scissors a 2.5cm simple appearing cyst was dissected from the broad ligament.  Broad ligament was reapproximated and hemostasis was achieved.    The fascia was then reapproximated with running sutures of 0PDS. The skin was closed with 4-0Vicryl.   Instrument, sponge, and needle counts were correct prior the abdominal closure and were correct at the conclusion of the case.    Findings:  Left ovarian mass, left broad ligament inclusion cyst, viable female infant   Estimated Blood Loss: 1200cc   Urine Output: 50CC OF tea colored urine  Specimens: @ORSPECIMEN @   Complications: no complications  Disposition: PACU - hemodynamically stable.   Maternal Condition: stable   Baby condition / location:  Couplet care / Skin to Skin  Attending Attestation: I was present and scrubbed for the entire procedure.   Signed: Surgeon(s): Marylynn Pearson, MD Cyril Mourning, MD

## 2014-01-12 ENCOUNTER — Encounter (HOSPITAL_COMMUNITY): Payer: Self-pay | Admitting: Obstetrics and Gynecology

## 2014-01-12 LAB — CBC
HEMATOCRIT: 26.8 % — AB (ref 36.0–46.0)
Hemoglobin: 8.8 g/dL — ABNORMAL LOW (ref 12.0–15.0)
MCH: 26.7 pg (ref 26.0–34.0)
MCHC: 32.8 g/dL (ref 30.0–36.0)
MCV: 81.5 fL (ref 78.0–100.0)
Platelets: 387 10*3/uL (ref 150–400)
RBC: 3.29 MIL/uL — AB (ref 3.87–5.11)
RDW: 14.6 % (ref 11.5–15.5)
WBC: 16.9 10*3/uL — AB (ref 4.0–10.5)

## 2014-01-12 LAB — BIRTH TISSUE RECOVERY COLLECTION (PLACENTA DONATION)

## 2014-01-12 MED ORDER — RHO D IMMUNE GLOBULIN 1500 UNIT/2ML IJ SOSY
300.0000 ug | PREFILLED_SYRINGE | Freq: Once | INTRAMUSCULAR | Status: AC
  Administered 2014-01-12: 300 ug via INTRAVENOUS
  Filled 2014-01-12: qty 2

## 2014-01-12 NOTE — Progress Notes (Signed)
Subjective: Postpartum Day 1: Cesarean Delivery Patient reports tolerating PO.    Objective: Vital signs in last 24 hours: Temp:  [97 F (36.1 C)-98.6 F (37 C)] 98 F (36.7 C) (11/11 0100) Pulse Rate:  [65-76] 76 (11/11 0100) Resp:  [14-22] 22 (11/11 0100) BP: (98-130)/(50-80) 116/60 mmHg (11/11 0100) SpO2:  [96 %-100 %] 96 % (11/11 0100)  Physical Exam:  General: alert and cooperative Lochia: appropriate Uterine Fundus: firm Incision: healing well DVT Evaluation: No evidence of DVT seen on physical exam. Negative Homan's sign. No cords or calf tenderness. Calf/Ankle edema is present.   Recent Labs  01/10/14 1226 01/12/14 0530  HGB 9.9* 8.8*  HCT 31.2* 26.8*    Assessment/Plan: Status post Cesarean section. Doing well postoperatively.  Continue current care.  Mylin Gignac G 01/12/2014, 8:19 AM

## 2014-01-12 NOTE — Plan of Care (Signed)
Problem: Phase I Progression Outcomes Goal: Pain controlled with appropriate interventions Outcome: Completed/Met Date Met:  01/12/14 Goal: VS, stable, temp < 100.4 degrees F Outcome: Completed/Met Date Met:  01/12/14 Goal: Other Phase I Outcomes/Goals Outcome: Not Applicable Date Met:  70/11/00

## 2014-01-12 NOTE — Plan of Care (Signed)
Problem: Phase II Progression Outcomes Goal: Afebrile, VS remain stable Outcome: Completed/Met Date Met:  01/12/14

## 2014-01-12 NOTE — Plan of Care (Signed)
Problem: Phase II Progression Outcomes Goal: Other Phase II Outcomes/Goals Outcome: Completed/Met Date Met:  01/12/14

## 2014-01-12 NOTE — Plan of Care (Signed)
Problem: Phase I Progression Outcomes Goal: Voiding adequately Outcome: Completed/Met Date Met:  01/12/14

## 2014-01-12 NOTE — Plan of Care (Signed)
Problem: Phase II Progression Outcomes Goal: Tolerating diet Outcome: Completed/Met Date Met:  01/12/14

## 2014-01-12 NOTE — Plan of Care (Signed)
Problem: Phase II Progression Outcomes Goal: Progress activity as tolerated unless otherwise ordered Outcome: Completed/Met Date Met:  01/12/14

## 2014-01-12 NOTE — Plan of Care (Signed)
Problem: Phase II Progression Outcomes Goal: Pain controlled on oral analgesia Outcome: Completed/Met Date Met:  01/12/14

## 2014-01-13 LAB — RH IG WORKUP (INCLUDES ABO/RH)
ABO/RH(D): A NEG
FETAL SCREEN: NEGATIVE
Gestational Age(Wks): 39.1
UNIT DIVISION: 0

## 2014-01-13 MED ORDER — OXYCODONE-ACETAMINOPHEN 5-325 MG PO TABS: 1.0000 | ORAL_TABLET | ORAL | Status: AC | PRN

## 2014-01-13 MED ORDER — IBUPROFEN 600 MG PO TABS
600.0000 mg | ORAL_TABLET | Freq: Four times a day (QID) | ORAL | Status: AC
Start: 2014-01-13 — End: ?

## 2014-01-13 NOTE — Plan of Care (Signed)
Problem: Discharge Progression Outcomes Goal: Pain controlled with appropriate interventions Outcome: Completed/Met Date Met:  01/13/14 Goal: MMR given as ordered Outcome: Not Applicable Date Met:  28/97/91

## 2014-01-13 NOTE — Plan of Care (Signed)
Problem: Consults Goal: Postpartum Patient Education (See Patient Education module for education specifics.)  Outcome: Completed/Met Date Met:  01/13/14

## 2014-01-13 NOTE — Discharge Summary (Signed)
Obstetric Discharge Summary Reason for Admission: cesarean section Prenatal Procedures: ultrasound Intrapartum Procedures: cesarean: low cervical, transverse Postpartum Procedures: none Complications-Operative and Postpartum: none HEMOGLOBIN  Date Value Ref Range Status  01/12/2014 8.8* 12.0 - 15.0 Owens/dL Final   HCT  Date Value Ref Range Status  01/12/2014 26.8* 36.0 - 46.0 % Final    Physical Exam:  General: alert and cooperative Lochia: appropriate Uterine Fundus: firm Incision: healing well DVT Evaluation: No evidence of DVT seen on physical exam. Negative Homan's sign. No cords or calf tenderness. Calf/Ankle edema is present.  Discharge Diagnoses: Term Pregnancy-delivered and removal of broad ligament inclusion cyst  Discharge Information: Date: 01/13/2014 Activity: pelvic rest Diet: routine Medications: PNV, Ibuprofen and Percocet Condition: stable Instructions: refer to practice specific booklet Discharge to: home   Newborn Data: Live born female  Birth Weight: 7 lb 4.1 oz (3290 Owens) APGAR: 8, 10  Home with mother.  Angel Owens 01/13/2014, 8:56 AM

## 2014-01-13 NOTE — Plan of Care (Signed)
Problem: Discharge Progression Outcomes Goal: Remove staples per MD order Outcome: Not Applicable Date Met:  84/85/92

## 2014-01-13 NOTE — Plan of Care (Signed)
Problem: Discharge Progression Outcomes Goal: MMR given as ordered Outcome: Not Applicable Date Met:  01/13/14     

## 2014-01-13 NOTE — Plan of Care (Signed)
Problem: Phase II Progression Outcomes Goal: Incision intact & without signs/symptoms of infection Outcome: Completed/Met Date Met:  01/13/14 Goal: Rh isoimmunization per orders Outcome: Completed/Met Date Met:  01/13/14  Problem: Discharge Progression Outcomes Goal: Tolerating diet Outcome: Completed/Met Date Met:  01/13/14

## 2014-01-14 LAB — TYPE AND SCREEN
ABO/RH(D): A NEG
ANTIBODY SCREEN: POSITIVE
DAT, IgG: NEGATIVE
UNIT DIVISION: 0
Unit division: 0

## 2014-02-21 ENCOUNTER — Other Ambulatory Visit: Payer: Self-pay | Admitting: Obstetrics and Gynecology

## 2014-02-23 LAB — CYTOLOGY - PAP

## 2015-05-10 ENCOUNTER — Encounter (HOSPITAL_BASED_OUTPATIENT_CLINIC_OR_DEPARTMENT_OTHER): Payer: Self-pay | Admitting: Emergency Medicine

## 2015-05-10 ENCOUNTER — Emergency Department (HOSPITAL_BASED_OUTPATIENT_CLINIC_OR_DEPARTMENT_OTHER)
Admission: EM | Admit: 2015-05-10 | Discharge: 2015-05-10 | Disposition: A | Discharge status: Home / Self Care | Payer: BC Managed Care – PPO | Attending: Emergency Medicine | Admitting: Emergency Medicine

## 2015-05-10 DIAGNOSIS — H6991 Unspecified Eustachian tube disorder, right ear: Secondary | ICD-10-CM | POA: Diagnosis not present

## 2015-05-10 DIAGNOSIS — R011 Cardiac murmur, unspecified: Secondary | ICD-10-CM | POA: Diagnosis not present

## 2015-05-10 DIAGNOSIS — J45909 Unspecified asthma, uncomplicated: Secondary | ICD-10-CM | POA: Insufficient documentation

## 2015-05-10 DIAGNOSIS — Z79899 Other long term (current) drug therapy: Secondary | ICD-10-CM | POA: Diagnosis not present

## 2015-05-10 DIAGNOSIS — R0982 Postnasal drip: Secondary | ICD-10-CM | POA: Insufficient documentation

## 2015-05-10 DIAGNOSIS — Z87891 Personal history of nicotine dependence: Secondary | ICD-10-CM | POA: Diagnosis not present

## 2015-05-10 DIAGNOSIS — H6981 Other specified disorders of Eustachian tube, right ear: Secondary | ICD-10-CM

## 2015-05-10 DIAGNOSIS — H9202 Otalgia, left ear: Secondary | ICD-10-CM | POA: Diagnosis present

## 2015-05-10 MED ORDER — PREDNISONE 50 MG PO TABS
60.0000 mg | ORAL_TABLET | Freq: Once | ORAL | Status: AC
  Administered 2015-05-10: 60 mg via ORAL

## 2015-05-10 MED ORDER — AZELASTINE HCL 0.1 % NA SOLN: 2.0000 | Freq: Two times a day (BID) | NASAL | Status: AC

## 2015-05-10 MED ORDER — PREDNISONE 20 MG PO TABS: ORAL_TABLET | ORAL | Status: AC

## 2015-05-10 MED ORDER — LORATADINE 10 MG PO TABS
10.0000 mg | ORAL_TABLET | Freq: Once | ORAL | Status: AC
  Administered 2015-05-10: 10 mg via ORAL

## 2015-05-10 MED ORDER — PSEUDOEPHEDRINE HCL ER 120 MG PO TB12
120.0000 mg | ORAL_TABLET | Freq: Two times a day (BID) | ORAL | Status: DC
  Administered 2015-05-10: 120 mg via ORAL
  Filled 2015-05-10: qty 1

## 2015-05-10 MED ORDER — CETIRIZINE-PSEUDOEPHEDRINE ER 5-120 MG PO TB12: 1.0000 | ORAL_TABLET | Freq: Every day | ORAL | Status: AC

## 2015-05-10 MED ORDER — ACETAMINOPHEN 500 MG PO TABS
1000.0000 mg | ORAL_TABLET | Freq: Once | ORAL | Status: AC
  Administered 2015-05-10: 1000 mg via ORAL

## 2015-05-10 NOTE — ED Notes (Signed)
Pt reports right ear pain , nasal congestion with scratchy throat

## 2015-05-10 NOTE — ED Provider Notes (Addendum)
CSN: [id number]     Arrival date & time 05/10/15  [id number] History   First MD Initiated Contact with Patient 05/10/15 0324     No chief complaint on file.    (Consider location/radiation/quality/duration/timing/severity/associated sxs/prior Treatment) Patient is a 35 y.o. female presenting with ear pain. The history is provided by the patient.  Otalgia Location:  Right Behind ear:  No abnormality Quality:  Aching Severity:  Moderate Onset quality:  Sudden Timing:  Constant Progression:  Unchanged Chronicity:  New Context: not direct blow and not elevation change   Relieved by:  Nothing Worsened by:  Nothing tried Ineffective treatments:  OTC medications Associated symptoms: congestion   Associated symptoms: no cough, no fever, no neck pain and no rhinorrhea   Risk factors: no recent travel     Past Medical History  Diagnosis Date  . Allergy   . Asthma     mild, allergy induced  . Heart murmur     asymptomatic   Past Surgical History  Procedure Laterality Date  . Wisdom tooth extraction    . Tonsillectomy    . Cesarean section N/A 01/11/2014    Procedure: CESAREAN SECTION and left oophorectomy and resection of left broad ligament inclusion cyst ;  Surgeon: Marylynn Pearson, MD;  Location: [facility];  Service: Obstetrics;  Laterality: N/A;  Primary  edc 01/17/14   Family History  Problem Relation Age of Onset  . Cancer Maternal Grandmother     pancreatic  . Cancer Maternal Grandfather     liver  . Cancer Paternal Grandfather     lung  . Cancer Other 50    ovarian cancer   Social History  Substance Use Topics  . Smoking status: Former Research scientist (life sciences)  . Smokeless tobacco: None  . Alcohol Use: No   OB History    Gravida Para Term Preterm AB TAB SAB Ectopic Multiple Living   3 1 1  0 2 2 0 0 0 1     Review of Systems  Constitutional: Negative for fever.  HENT: Positive for congestion and ear pain. Negative for drooling and rhinorrhea.   Respiratory: Negative for cough.    Musculoskeletal: Negative for neck pain.  All other systems reviewed and are negative.     Allergies  Molds & smuts  Home Medications   Prior to Admission medications   Medication Sig Start Date End Date Taking? Authorizing Provider  fluticasone (FLONASE) 50 MCG/ACT nasal spray Place 1 spray into both nostrils daily as needed.  09/12/12   Historical Provider, MD  ibuprofen (ADVIL,MOTRIN) 600 MG tablet Take 1 tablet (600 mg total) by mouth every 6 (six) hours. 01/13/14   Juanda Chance, NP  montelukast (SINGULAIR) 10 MG tablet Take 10 mg by mouth at bedtime.    Historical Provider, MD  oxyCODONE-acetaminophen (PERCOCET/ROXICET) 5-325 MG per tablet Take 1 tablet by mouth every 4 (four) hours as needed (for pain scale less than 7). 01/13/14   Juanda Chance, NP  Prenatal Vit-Fe Fumarate-FA (MULTIVITAMIN-PRENATAL) 27-0.8 MG TABS tablet Take 1 tablet by mouth daily at 12 noon.    Historical Provider, MD  valACYclovir (VALTREX) 1000 MG tablet Take 1,000 mg by mouth 2 (two) times daily.    Historical Provider, MD  vitamin C (ASCORBIC ACID) 250 MG tablet Take 250 mg by mouth daily.    Historical Provider, MD   BP 119/86 mmHg  Pulse 72  Temp(Src) 98.2 F (36.8 C) (Oral)  Resp 18  Ht 5\' 6"  (1.676 m)  Wt 144 lb (  65.318 kg)  BMI 23.25 kg/m2  SpO2 100% Physical Exam  Constitutional: She is oriented to person, place, and time. She appears well-developed and well-nourished. No distress.  HENT:  Head: Normocephalic and atraumatic.  Right Ear: External ear normal. No mastoid tenderness. Tympanic membrane is not injected, not scarred, not perforated, not erythematous, not retracted and not bulging. No hemotympanum.  Left Ear: No mastoid tenderness. Tympanic membrane is not injected, not scarred, not perforated, not erythematous, not retracted and not bulging. No hemotympanum.  Nose: No rhinorrhea.  Mouth/Throat: Oropharynx is clear and moist.  No fluid levels with transillumination   Eyes:  Conjunctivae and EOM are normal. Pupils are equal, round, and reactive to light.  Neck: Normal range of motion. Neck supple.  Cardiovascular: Normal rate and regular rhythm.   Pulmonary/Chest: Effort normal and breath sounds normal. No stridor. No respiratory distress. She has no wheezes. She has no rales.  Abdominal: Soft. Bowel sounds are normal. There is no tenderness. There is no rebound and no guarding.  Musculoskeletal: Normal range of motion.  Neurological: She is alert and oriented to person, place, and time.  Skin: Skin is warm and dry.  Psychiatric: She has a normal mood and affect.    ED Course  Procedures (including critical care time) Labs Review Labs Reviewed - No data to display  Imaging Review No results found. I have personally reviewed and evaluated these images and lab results as part of my medical decision-making.   EKG Interpretation None      MDM   Final diagnoses:  None    Based on symptoms and time course, vital signs and exam I do not believe this patient has a bacterial sinus infection.  I do not believe that antibiotics are indicated in this patient.  I have discussed this with the patient.  She states she always gets antibiotics.  Patient is well appearing with normal vital signs and exam.  The symptoms have only been going on 2 days without fever nor purulent drainage no fluid level in the sinuses.  The ear plan is being caused by eustachian tube dysfunction which we will treat.  Given the patient's concerns will also provide a referral to ENT.      Veatrice Kells, MD 05/10/15 [id number]  Srija Southard, MD 05/10/15 [phone]

## 2015-06-15 ENCOUNTER — Other Ambulatory Visit (INDEPENDENT_AMBULATORY_CARE_PROVIDER_SITE_OTHER): Payer: Self-pay | Admitting: Otolaryngology

## 2015-06-15 DIAGNOSIS — J329 Chronic sinusitis, unspecified: Secondary | ICD-10-CM

## 2015-07-06 ENCOUNTER — Ambulatory Visit
Admission: RE | Admit: 2015-07-06 | Discharge: 2015-07-06 | Disposition: A | Discharge status: Home / Self Care | Payer: BC Managed Care – PPO | Source: Ambulatory Visit | Attending: Otolaryngology | Admitting: Otolaryngology

## 2015-07-06 DIAGNOSIS — J329 Chronic sinusitis, unspecified: Secondary | ICD-10-CM

## 2017-03-14 IMAGING — CT CT MAXILLOFACIAL W/O CM
3 series · 16 of 47 positions shown, 19 images · non-contrast
Comparison: None.

CLINICAL DATA: Chronic sinusitis.  History of nasal fracture.

EXAM:
CT MAXILLOFACIAL WITHOUT CONTRAST
TECHNIQUE: Multidetector CT imaging of the maxillofacial structures was
performed. Multiplanar CT image reconstructions were also generated.
A small metallic BB was placed on the right temple in order to
reliably differentiate right from left.

[Series 4: soft tissue · axial · 0.47mm/px · z∈[+378,+531]mm · 10 of 179 slices shown, 13 images]
[im 13/179  brain]
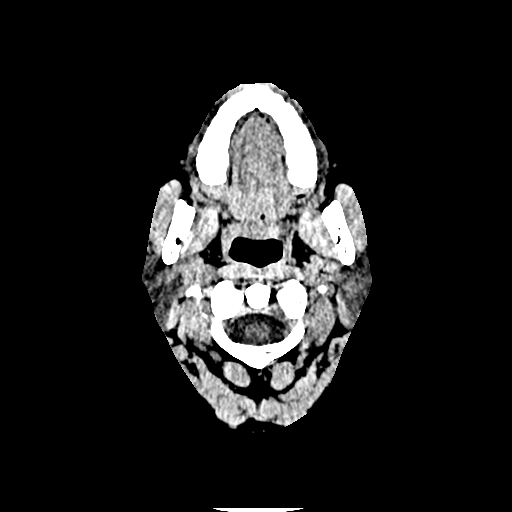
[im 13/179  bone]
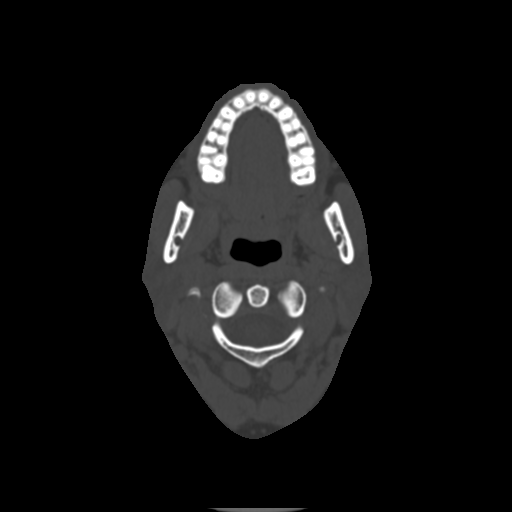
[im 31/179  bone]
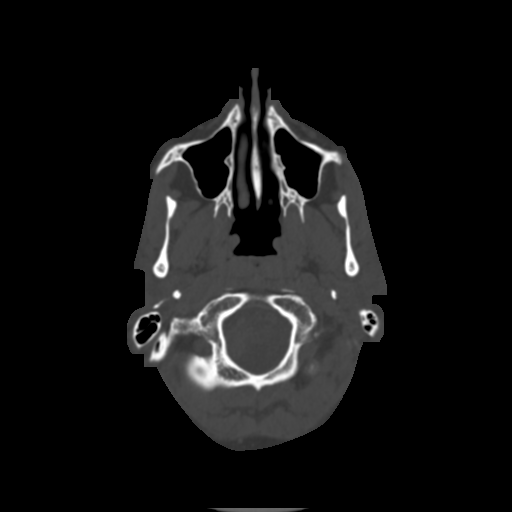
[im 50/179  bone]
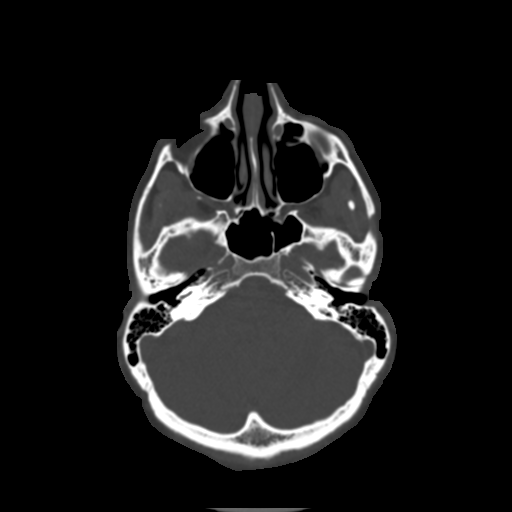
[im 62/179  bone]
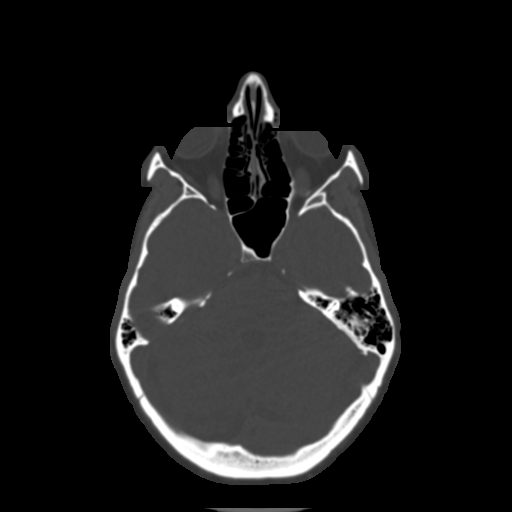
[im 80/179  brain]
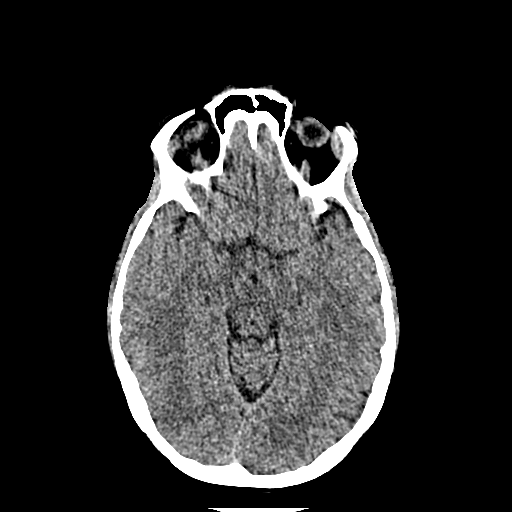
[im 80/179  bone]
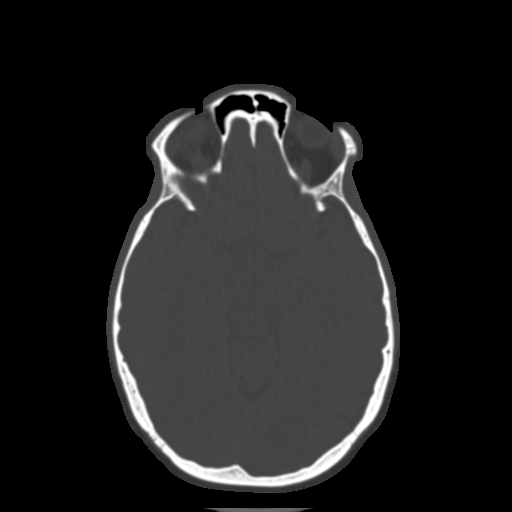
[im 99/179  bone]
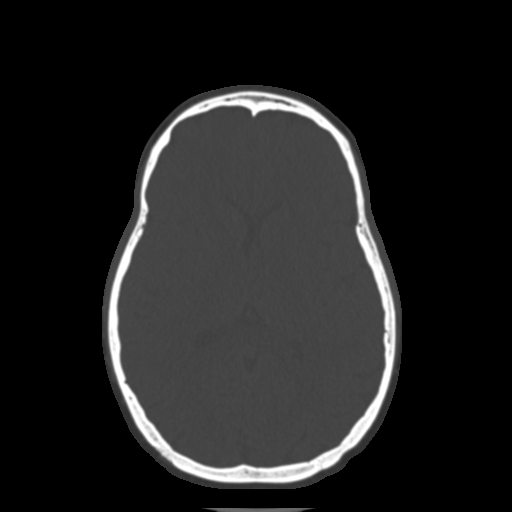
[im 117/179  bone]
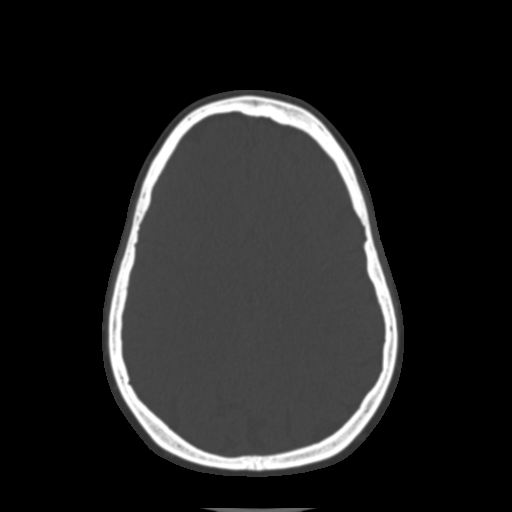
[im 136/179  bone]
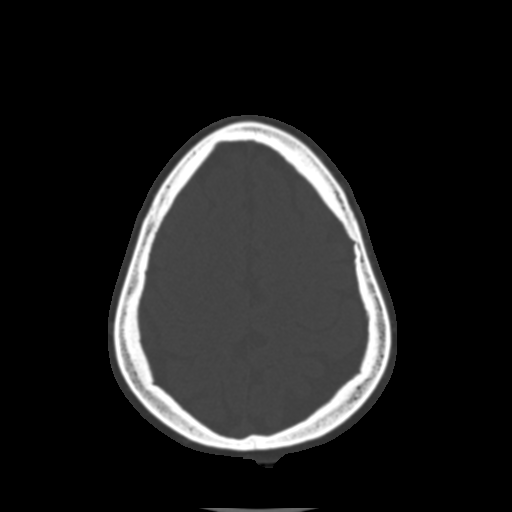
[im 148/179  brain]
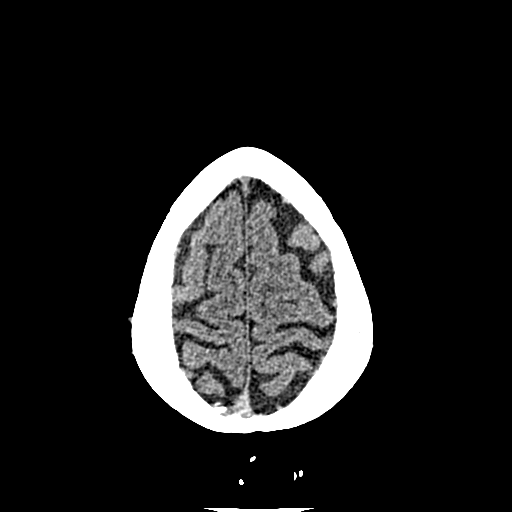
[im 148/179  bone]
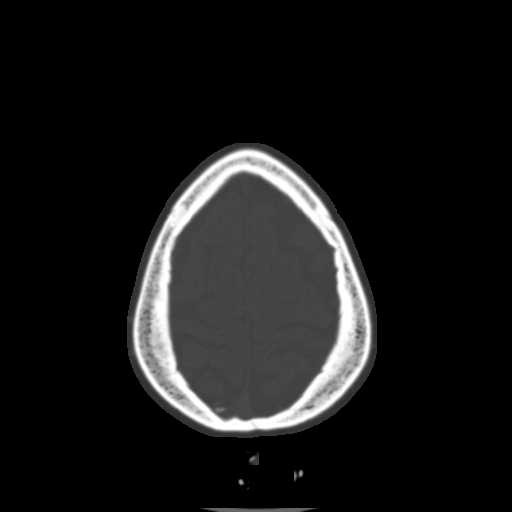
[im 166/179  bone]
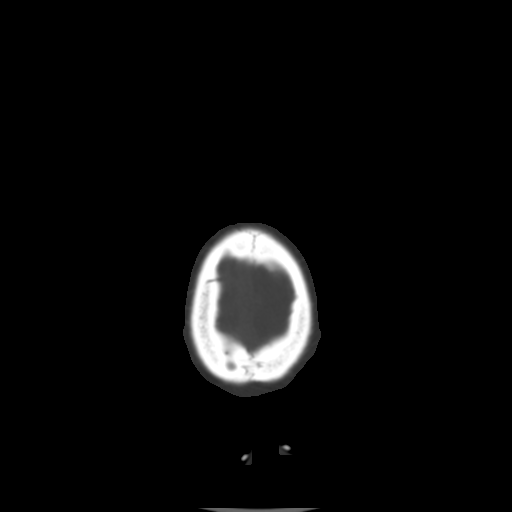

[Series 7: cor soft · coronal · 0.32mm/px · 3 of 112 slices shown]
[im 38/112  bone]
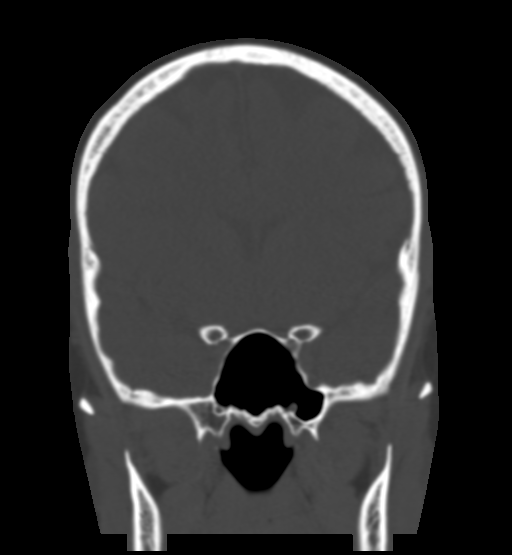
[im 50/112  bone]
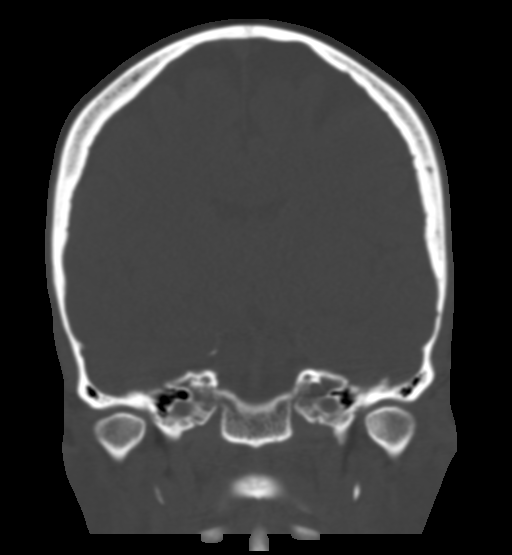
[im 62/112  bone]
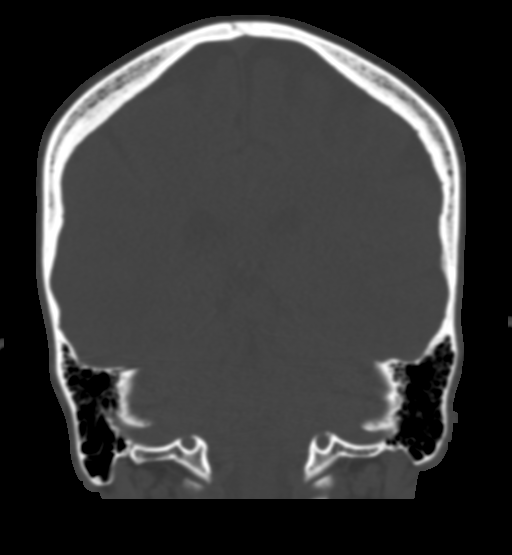

[Series 8: sag soft · sagittal · 0.34mm/px · 3 of 94 slices shown]
[im 32/94  bone]
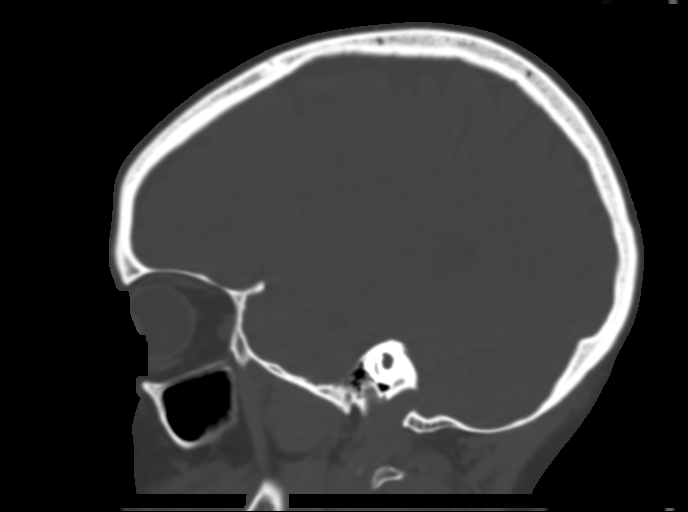
[im 47/94  bone]
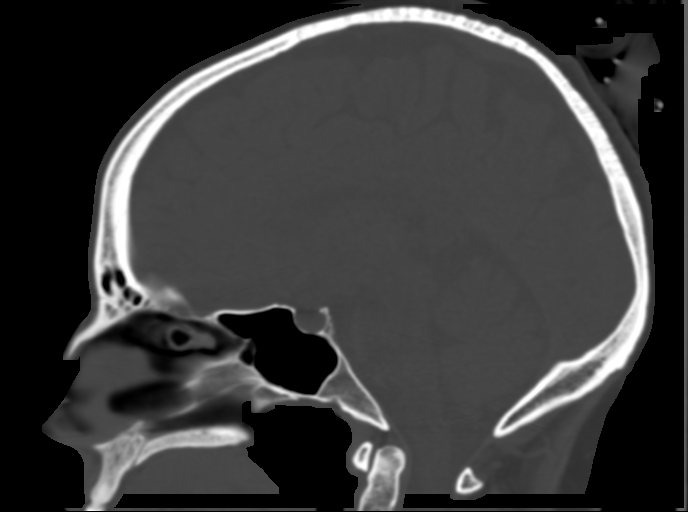
[im 63/94  bone]
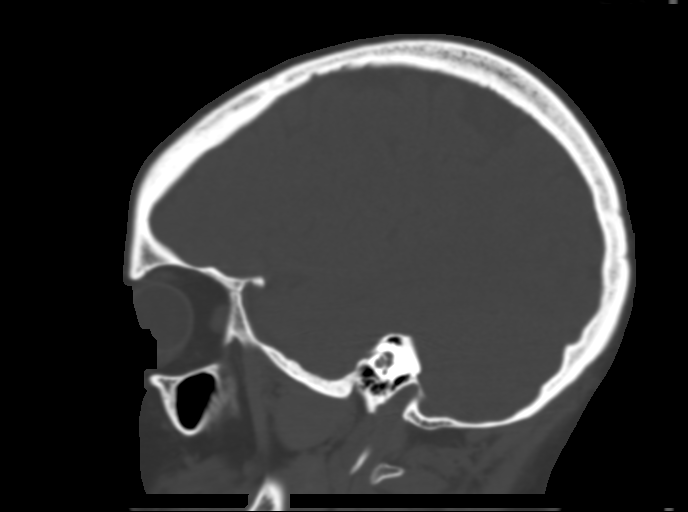

[16 of 47 positions shown; findings below may reference images not displayed]

FINDINGS: The maxillary, ethmoid, frontal, and sphenoid sinuses are clear.
Nasal septum is essentially midline. No concha bullosa. No
infundibular narrowing.

Negative intracranial compartment. Negative middle ear and mastoid
air cells. Unremarkable nasopharynx and orbits. Extracranial soft
tissues unremarkable.
IMPRESSION: Paranasal sinuses are clear.
